# Patient Record
Sex: Male | Born: 1975 | Race: White | Hispanic: No | Marital: Married | Smoking: Former smoker
Health system: Southern US, Community
[De-identification: ages and names within clinical notes are randomized; demographics above are authoritative.]

## PROBLEM LIST (undated history)

## (undated) DIAGNOSIS — F1011 Alcohol abuse, in remission: Secondary | ICD-10-CM

## (undated) DIAGNOSIS — F1911 Other psychoactive substance abuse, in remission: Secondary | ICD-10-CM

## (undated) HISTORY — DX: Other psychoactive substance abuse, in remission: F19.11

## (undated) HISTORY — DX: Alcohol abuse, in remission: F10.11

---

## 2005-10-21 HISTORY — PX: HAND NERVE REPAIR: SHX1728

## 2008-01-21 ENCOUNTER — Encounter: Payer: Self-pay | Admitting: Internal Medicine

## 2008-01-22 ENCOUNTER — Ambulatory Visit: Payer: Self-pay | Admitting: Internal Medicine

## 2008-01-22 DIAGNOSIS — M5430 Sciatica, unspecified side: Secondary | ICD-10-CM

## 2008-01-25 LAB — CONVERTED CEMR LAB
HDL: 38 mg/dL — ABNORMAL LOW (ref >39.0)
LDL Cholesterol: 135 mg/dL — ABNORMAL HIGH (ref 0–99)
Total CHOL/HDL Ratio: 4.8
Triglycerides: 52 mg/dL (ref 0–149)
VLDL: 10 mg/dL (ref 0–40)

## 2008-03-02 ENCOUNTER — Ambulatory Visit (HOSPITAL_COMMUNITY): Admission: RE | Admit: 2008-03-02 | Discharge: 2008-03-02 | Payer: Self-pay | Admitting: Orthopedic Surgery

## 2008-03-16 ENCOUNTER — Ambulatory Visit (HOSPITAL_COMMUNITY): Admission: RE | Admit: 2008-03-16 | Discharge: 2008-03-17 | Payer: Self-pay | Admitting: Orthopedic Surgery

## 2009-01-03 ENCOUNTER — Emergency Department (HOSPITAL_COMMUNITY): Admission: EM | Admit: 2009-01-03 | Discharge: 2009-01-03 | Payer: Self-pay | Admitting: Family Medicine

## 2009-01-10 ENCOUNTER — Ambulatory Visit: Payer: Self-pay | Admitting: Family Medicine

## 2009-01-10 DIAGNOSIS — M79609 Pain in unspecified limb: Secondary | ICD-10-CM | POA: Insufficient documentation
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
## 2009-11-13 ENCOUNTER — Ambulatory Visit: Payer: Self-pay | Admitting: Internal Medicine

## 2009-11-13 DIAGNOSIS — J019 Acute sinusitis, unspecified: Secondary | ICD-10-CM | POA: Insufficient documentation

## 2010-08-13 ENCOUNTER — Emergency Department (HOSPITAL_COMMUNITY): Admission: EM | Admit: 2010-08-13 | Discharge: 2010-08-13 | Payer: Self-pay | Admitting: Emergency Medicine

## 2010-11-20 NOTE — Assessment & Plan Note (Signed)
Summary: ST,CONGESTION/CLE   Vital Signs:  Patient profile:   35 year old male Weight:      240 pounds BMI:     33.59 Temp:     97.9 degrees F oral Pulse rate:   72 / minute Pulse rhythm:   regular Resp:     14 per minute BP sitting:   138 / 80  (left arm) Cuff size:   large  Vitals Entered By: Mervin Hack CMA Duncan Dull) (November 13, 2009 2:28 PM) CC: sinus, headache, sore throat   History of Present Illness: Has been congested with head cold for 2 months variable symptoms---just never going away both daughters have been sick intermittently  worst in AM , then seems to clear some during day Has noted some pain along left face Now with pain along left neck and posterior neck  No fever that he can tell Nasal drainage with PND causing sore throat not much cough Clear to yellow and occ brownish mucus No ear pain  Hasn't really been taking anything except robitussin  Allergies: No Known Drug Allergies  Past History:  Past medical, surgical, family and social histories (including risk factors) reviewed for relevance to current acute and chronic problems.  Past Medical History: Reviewed history from 01/22/2008 and no changes required. 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 03/20/2006  Right hand repair (tendons, etc)    [facility]  Family History: Reviewed history from 01/22/2008 and no changes required. Mom and Dad generally healthy Brother died of apparent overdose 03-20-2008 Alcoholism and ?drug abuse in the family 2 stepbrothers, 1 stepsister No CAD, DM HTN and high chol in family Mat GM & aunt with lung cancer No prostate or colon cancer  Social History: Occupation: Forensic psychologist girls Former Smoker Alcohol use-no    Quit 3 years ago  Drug use-no   Quit 3 years ago  Review of Systems       No vomiting  slight loose stools Some brief  chills yesterday--like 20 seconds slight nausea last night  Physical Exam  General:  alert.  NAD Head:  right maxillary tenderness no frontal tenderness Ears:  R ear normal and L ear normal.   Nose:  marked inflammation bilat green mucus on right Mouth:  no erythema and no exudates.   Neck:  supple and no masses.  Tender nodes in the right anterior cervical chain Lungs:  normal respiratory effort, no intercostal retractions, no accessory muscle use, and normal breath sounds.     Impression & Recommendations:  Problem # 1:  SINUSITIS - ACUTE-NOS (ICD-461.9) Assessment New  clearly seems to have bacterial infeciton at this point will recommend analgesics amoxil  His updated medication list for this problem includes:    Amoxicillin 500 Mg Tabs (Amoxicillin) .Marland Kitchen... 2 tabs by mouth two times a day for sinus infection4  Complete Medication List: 1)  Multivitamins Tabs (Multiple vitamin) .... Take 1 tablet by mouth once a day 2)  Amoxicillin 500 Mg Tabs (Amoxicillin) .... 2 tabs by mouth two times a day for sinus infection4  Patient Instructions: 1)  Please schedule a follow-up appointment as needed .  Prescriptions: AMOXICILLIN 500 MG TABS (AMOXICILLIN) 2 tabs by mouth two times a day for sinus infection4  #40 x 0   Entered and Authorized by:   Cindee Salt MD   Signed by:   Cindee Salt MD on 11/13/2009   Method  used:   Electronically to        [facility]  [address] (retail)       [address]       [place], [place]  [address]       Ph: [phone] or [phone]       Fax: [phone]   RxID:   [id number]   Current Allergies (reviewed today): No known allergies

## 2011-03-05 NOTE — Op Note (Signed)
NAMEBHARGAV, Dustin Hanna            ACCOUNT NO.:  [id number]   MEDICAL RECORD NO.:  [id number]           PATIENT TYPE:   LOCATION:                                 FACILITY:   PHYSICIAN:  Alvy Beal, MD    DATE OF BIRTH:  [birth year]   DATE OF PROCEDURE:  DATE OF DISCHARGE:                               OPERATIVE REPORT   PREOPERATIVE DIAGNOSIS:  Left L5-S1 disk herniation.   POSTOPERATIVE DIAGNOSIS:  Left L5-S1 disk herniation.   OPERATIVE PROCEDURE:  Posterior lumbar laminectomy for diskectomy, CPT  code 630.   COMPLICATIONS:  None.   CONDITION:  Stable.   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  He states that the pain has gone to the  point where he cannot function.  Despite appropriate conservative  management, he continues to have significant pain.  As a result, he  elected to proceed with surgery.  All appropriate risks, benefits, and  alternatives were discussed with the patient and consent was obtained.   OPERATIVE NOTE:  The patient was brought to the operating room, placed  supine on the operating table.  After successful induction of general  anesthesia endotracheal intubation, TED were applied.  He was turned  prone onto a Wilson frame.  All bony prominences were well padded and  the back was prepped and draped in a standard fashion.   Two 18-gauge needles were placed on the back to localize for incision.  Once the L5-S1 interbody space was identified, a midline 2-inch incision  was made centered at that disk space.  Sharp dissection was carried out  down through the deep fascia.  Using a Cobb elevator, I resected all of  the soft tissue to expose the L5-S1 interspinous process space on the  left hand side.  I then placed a Taylor retractor, placed a Penfield 4  underneath the lamina of L5, and took a second intraoperative x-ray.  Confirming that I was that the L5-S1 level, I then proceeded with the  diskectomy.   L5 laminotomy was performed using a 3-mm Kerrison, and then I developed  a plane between the ligamentum flavum and underlying dura.  I resected a  portion of the ligamentum flavum to more down into the lateral recess.  At this point, I could palpate the disk fragment.  I used a Penfield 4  to sweep the thecal sac and nerve root medially so that I could have a  better visualization of the disk fragment.  Once this was accomplished,  I incised the disk space with a 15-blade scalpel and then used a  combination of pituitary rongeurs, curettes, and Kerrison rongeurs to  resect the disk fragment.  A generous amount of herniated disk material  was resected.  At this point, once I had majority of the disk herniation  now, I noted there was less tension on the traversing S1 nerve root.  I  then took a dural elevator (hockey stick) and passed  it inferiorly,  medially, and superiorly and in the lateral recess.  This confirmed that  there was no resistance and no significant remaining disk fragment.  I  then took a nerve hook and swept underneath the annulus in a  circumferential manner to ensure that there was no free fragments of  disk.  Once I confirmed that I had removed all the fragments of disk and  the nerve root was no longer under tension, I then irrigated the wound  copiously with normal saline, obtained hemostasis using bipolar  electrocautery, and maintained it with a thrombin-soaked Gelfoam patty  and closed the deep fascia with interrupted #1 Vicryl sutures and  superficial 2-0 Vicryl  sutures.  I used a 4-0 Monocryl to close the skin.  Steri-Strips and dry  dressing were applied.  The patient was extubated and transferred to the  PACU without incident.  At the end of the case, all needle and sponge  counts were correct.      Alvy Beal, MD  Electronically Signed     DDB/MEDQ  D:   03/16/2008  T:  03/17/2008  Job:  [id number]

## 2011-07-17 LAB — CBC
Hemoglobin: 15.3
MCHC: 35.8
Platelets: 221
RDW: 12.9

## 2011-07-17 LAB — DIFFERENTIAL
Basophils Absolute: 0
Basophils Relative: 0
Monocytes Absolute: 0.4
Neutro Abs: 3.1

## 2011-07-23 ENCOUNTER — Encounter: Payer: Self-pay | Admitting: Internal Medicine

## 2011-07-24 ENCOUNTER — Ambulatory Visit (INDEPENDENT_AMBULATORY_CARE_PROVIDER_SITE_OTHER): Payer: BC Managed Care – PPO | Admitting: Internal Medicine

## 2011-07-24 ENCOUNTER — Encounter: Payer: Self-pay | Admitting: Internal Medicine

## 2011-07-24 DIAGNOSIS — M71339 Other bursal cyst, unspecified wrist: Secondary | ICD-10-CM | POA: Insufficient documentation

## 2011-07-24 DIAGNOSIS — B356 Tinea cruris: Secondary | ICD-10-CM | POA: Insufficient documentation

## 2011-07-24 DIAGNOSIS — M6740 Ganglion, unspecified site: Secondary | ICD-10-CM

## 2011-07-24 MED ORDER — KETOCONAZOLE 2 % EX CREA: TOPICAL_CREAM | Freq: Two times a day (BID) | CUTANEOUS | Status: AC

## 2011-07-24 NOTE — Progress Notes (Signed)
  Subjective:    Patient ID: Dustin Hanna, male    DOB: Feb 25, 1976, 35 y.o.   MRN: [id number]  HPI sarted with right wrist pain about a month ago Repetitive movement tightening conduit couplings Also some pain on left This seemed to ease up  Developed lump on volar right wrist then Tender Wife wanted him to get it checked  No current outpatient prescriptions on file prior to visit.    No Known Allergies  Past Medical History  Diagnosis Date  . H/O alcohol abuse   . History of drug abuse     Past Surgical History  Procedure Date  . Hand nerve repair 2007    tendon repair    Family History  Problem Relation Age of Onset  . Healthy Mother   . Healthy Father   . Diabetes Neg Hx   . Heart disease Neg Hx   . Cancer Neg Hx     History   Social History  . Marital Status: Married    Spouse Name: N/A    Number of Children: 2  . Years of Education: N/A   Occupational History  . electrician--commercial installation    Social History Main Topics  . Smoking status: Former Smoker    Types: Cigarettes  . Smokeless tobacco: Never Used  . Alcohol Use: No     quit 3 years ago  . Drug Use: No     quit 3 years ago  . Sexually Active: Not on file   Other Topics Concern  . Not on file   Social History Narrative  . No narrative on file   Review of Systems No sig hand weakness now--some at first No joint issues     Objective:   Physical Exam  Constitutional: He appears well-developed and well-nourished. No distress.  Musculoskeletal:       Both wrists have no swelling Full ROM  Apparent synovial cyst on volar ulnar right wrist--mild tenderness  Neurological:       Normal strength in hands          Assessment & Plan:

## 2011-07-24 NOTE — Assessment & Plan Note (Signed)
Discussed that this may be self limited Can try compression Referral to hand surgeon if pain is an issue

## 2011-07-24 NOTE — Patient Instructions (Signed)
Please use compression on the synovial cyst If pain gets very bad, call for referral to hand surgeon  Use powder over the prescription cream in the groin to reduce moisture

## 2011-07-24 NOTE — Assessment & Plan Note (Signed)
Has had some sig itching since needing to be in a harness all day lately OTC creams only temporary relief  Will give ketoconazole Powder also

## 2011-08-11 ENCOUNTER — Emergency Department (HOSPITAL_COMMUNITY)
Admission: EM | Admit: 2011-08-11 | Discharge: 2011-08-11 | Disposition: A | Discharge status: Home / Self Care | Payer: BC Managed Care – PPO | Attending: Emergency Medicine | Admitting: Emergency Medicine

## 2011-08-11 ENCOUNTER — Emergency Department (HOSPITAL_COMMUNITY): Payer: BC Managed Care – PPO

## 2011-08-11 DIAGNOSIS — R079 Chest pain, unspecified: Secondary | ICD-10-CM | POA: Insufficient documentation

## 2011-08-11 DIAGNOSIS — R209 Unspecified disturbances of skin sensation: Secondary | ICD-10-CM | POA: Insufficient documentation

## 2011-08-11 LAB — POCT I-STAT TROPONIN I
Troponin i, poc: 0 ng/mL (ref 0.00–0.08)
Troponin i, poc: 0 ng/mL (ref 0.00–0.08)

## 2011-08-11 LAB — CBC
HCT: 40.7 % (ref 39.0–52.0)
Platelets: 199 10*3/uL (ref 150–400)
RDW: 12.6 % (ref 11.5–15.5)
WBC: 5.3 10*3/uL (ref 4.0–10.5)

## 2011-08-11 LAB — DIFFERENTIAL
Basophils Absolute: 0 10*3/uL (ref 0.0–0.1)
Eosinophils Absolute: 0.2 10*3/uL (ref 0.0–0.7)
Eosinophils Relative: 4 % (ref 0–5)
Lymphocytes Relative: 34 % (ref 12–46)

## 2011-08-11 LAB — POCT I-STAT, CHEM 8
Chloride: 103 mEq/L (ref 96–112)
HCT: 44 % (ref 39.0–52.0)
Potassium: 4 mEq/L (ref 3.5–5.1)

## 2011-10-10 ENCOUNTER — Ambulatory Visit: Payer: BC Managed Care – PPO | Admitting: Family Medicine

## 2014-06-09 ENCOUNTER — Ambulatory Visit: Payer: BC Managed Care – PPO | Admitting: Internal Medicine

## 2014-06-13 ENCOUNTER — Encounter: Payer: Self-pay | Admitting: Internal Medicine

## 2014-06-13 ENCOUNTER — Ambulatory Visit (INDEPENDENT_AMBULATORY_CARE_PROVIDER_SITE_OTHER): Payer: BC Managed Care – PPO | Admitting: Internal Medicine

## 2014-06-13 VITALS — BP 130/80 | HR 72 | Temp 98.1°F | Wt 256.0 lb

## 2014-06-13 DIAGNOSIS — Z23 Encounter for immunization: Secondary | ICD-10-CM

## 2014-06-13 DIAGNOSIS — E669 Obesity, unspecified: Secondary | ICD-10-CM

## 2014-06-13 DIAGNOSIS — R209 Unspecified disturbances of skin sensation: Secondary | ICD-10-CM

## 2014-06-13 LAB — CBC WITH DIFFERENTIAL/PLATELET
BASOS PCT: 0.5 % (ref 0.0–3.0)
Basophils Absolute: 0 10*3/uL (ref 0.0–0.1)
EOS ABS: 0.2 10*3/uL (ref 0.0–0.7)
EOS PCT: 3.2 % (ref 0.0–5.0)
HCT: 43.5 % (ref 39.0–52.0)
Hemoglobin: 15.1 g/dL (ref 13.0–17.0)
LYMPHS PCT: 28 % (ref 12.0–46.0)
Lymphs Abs: 1.9 10*3/uL (ref 0.7–4.0)
MCHC: 34.7 g/dL (ref 30.0–36.0)
MCV: 91.3 fl (ref 78.0–100.0)
MONO ABS: 0.5 10*3/uL (ref 0.1–1.0)
Monocytes Relative: 6.9 % (ref 3.0–12.0)
NEUTROS PCT: 61.4 % (ref 43.0–77.0)
Neutro Abs: 4.1 10*3/uL (ref 1.4–7.7)
PLATELETS: 248 10*3/uL (ref 150.0–400.0)
RBC: 4.77 Mil/uL (ref 4.22–5.81)
RDW: 12.8 % (ref 11.5–15.5)
WBC: 6.7 10*3/uL (ref 4.0–10.5)

## 2014-06-13 LAB — COMPREHENSIVE METABOLIC PANEL
ALBUMIN: 4.3 g/dL (ref 3.5–5.2)
ALK PHOS: 68 U/L (ref 39–117)
ALT: 60 U/L — ABNORMAL HIGH (ref 0–53)
AST: 35 U/L (ref 0–37)
BUN: 16 mg/dL (ref 6–23)
CALCIUM: 9.4 mg/dL (ref 8.4–10.5)
CHLORIDE: 102 meq/L (ref 96–112)
CO2: 29 meq/L (ref 19–32)
Creatinine, Ser: 0.9 mg/dL (ref 0.4–1.5)
GFR: 106.91 mL/min (ref >60.00)
GLUCOSE: 94 mg/dL (ref 70–99)
POTASSIUM: 4.1 meq/L (ref 3.5–5.1)
SODIUM: 138 meq/L (ref 135–145)
TOTAL PROTEIN: 7.3 g/dL (ref 6.0–8.3)
Total Bilirubin: 0.6 mg/dL (ref 0.2–1.2)

## 2014-06-13 LAB — VITAMIN B12: VITAMIN B 12: 603 pg/mL (ref 211–911)

## 2014-06-13 LAB — T4, FREE: FREE T4: 0.97 ng/dL (ref 0.60–1.60)

## 2014-06-13 LAB — HEMOGLOBIN A1C: HEMOGLOBIN A1C: 5.3 % (ref 4.6–6.5)

## 2014-06-13 NOTE — Assessment & Plan Note (Signed)
Apparent neuropathy Doesn't seem to be disc related--no focal neuro findings otherwise Need to check labs--B12, thyroid, glucose Could be mechanical with lots of time in work boots Doesn't seem to have arch problems (they are symmetric and symptoms don't appear to be from pronation problems)  Will check labs Needs to make sure boots fit correctly--the thinks they do Might need reeval by back specialists--though this seems unlikely

## 2014-06-13 NOTE — Addendum Note (Signed)
Addended by: Despina Hidden on: 06/13/2014 12:01 PM   Modules accepted: Orders

## 2014-06-13 NOTE — Assessment & Plan Note (Signed)
Discussed healthy diet

## 2014-06-13 NOTE — Progress Notes (Signed)
Pre visit review using our clinic review tool, if applicable. No additional management support is needed unless otherwise documented below in the visit note. 

## 2014-06-13 NOTE — Progress Notes (Signed)
   Subjective:    Patient ID: Dustin Hanna, male    DOB: 09/14/1976, 38 y.o.   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   No current facility-administered medications on file prior to visit.    No Known Allergies  Past Medical History  Diagnosis Date  . H/O alcohol abuse   . History of drug abuse     Past Surgical History  Procedure Laterality Date  . Hand nerve repair  2007    tendon repair    Family History  Problem Relation Age of Onset  . Healthy Mother   . Healthy Father   . Diabetes Neg Hx   . Heart disease Neg Hx   . Cancer Neg Hx     History   Social History  . Marital Status: Married    Spouse Name: N/A    Number of Children: 4  . Years of Education: N/A   Occupational History  . electrician--commercial installation    Social History Main Topics  . Smoking status: Former Smoker    Types: Cigarettes  . Smokeless tobacco: Never Used  . Alcohol Use: No     Comment: quit 3 years ago  . Drug Use: No     Comment: quit 3 years ago  . Sexual Activity: Not on file   Other Topics Concern  . Not on file   Social History Narrative  . No narrative on file   Review of Systems Weight is up 6# in past 3 years Not careful with diet No fevers and not sick No increase in urination, ?increased thirst in day No slow healing that he is aware of      Objective:   Physical Exam  Constitutional: He appears well-nourished. No distress.  Neck: Normal range of motion. Neck supple. No thyromegaly present.  Cardiovascular: Normal rate, regular rhythm, normal heart sounds and intact distal pulses.  Exam reveals no gallop.   No murmur heard. Pulmonary/Chest: Effort normal and breath sounds normal. No respiratory distress. He has no wheezes. He has no rales.  Abdominal: Soft. There is no tenderness. There is no rebound.  Musculoskeletal: He exhibits no edema and no tenderness.  Lymphadenopathy:    He has no cervical adenopathy.  Neurological:  Abnormal sensation in toes bilaterally  Skin: No rash noted.  Psychiatric: He has a normal mood and affect. His behavior is normal.          Assessment & Plan:

## 2014-06-13 NOTE — Patient Instructions (Signed)
DASH Eating Plan °DASH stands for "Dietary Approaches to Stop Hypertension." The DASH eating plan is a healthy eating plan that has been shown to reduce high blood pressure (hypertension). Additional health benefits may include reducing the risk of type 2 diabetes mellitus, heart disease, and stroke. The DASH eating plan may also help with weight loss. °WHAT DO I NEED TO KNOW ABOUT THE DASH EATING PLAN? °For the DASH eating plan, you will follow these general guidelines: °· Choose foods with a percent daily value for sodium of less than 5% (as listed on the food label). °· Use salt-free seasonings or herbs instead of table salt or sea salt. °· Check with your health care provider or pharmacist before using salt substitutes. °· Eat lower-sodium products, often labeled as "lower sodium" or "no salt added." °· Eat fresh foods. °· Eat more vegetables, fruits, and low-fat dairy products. °· Choose whole grains. Look for the word "whole" as the first word in the ingredient list. °· Choose fish and skinless chicken or turkey more often than red meat. Limit fish, poultry, and meat to 6 oz (170 g) each day. °· Limit sweets, desserts, sugars, and sugary drinks. °· Choose heart-healthy fats. °· Limit cheese to 1 oz (28 g) per day. °· Eat more home-cooked food and less restaurant, buffet, and fast food. °· Limit fried foods. °· Cook foods using methods other than frying. °· Limit canned vegetables. If you do use them, rinse them well to decrease the sodium. °· When eating at a restaurant, ask that your food be prepared with less salt, or no salt if possible. °WHAT FOODS CAN I EAT? °Seek help from a dietitian for individual calorie needs. °Grains °Whole grain or whole wheat bread. Brown rice. Whole grain or whole wheat pasta. Quinoa, bulgur, and whole grain cereals. Low-sodium cereals. Corn or whole wheat flour tortillas. Whole grain cornbread. Whole grain crackers. Low-sodium crackers. °Vegetables °Fresh or frozen vegetables  (raw, steamed, roasted, or grilled). Low-sodium or reduced-sodium tomato and vegetable juices. Low-sodium or reduced-sodium tomato sauce and paste. Low-sodium or reduced-sodium canned vegetables.  °Fruits °All fresh, canned (in natural juice), or frozen fruits. °Meat and Other Protein Products °Ground beef (85% or leaner), grass-fed beef, or beef trimmed of fat. Skinless chicken or turkey. Ground chicken or turkey. Pork trimmed of fat. All fish and seafood. Eggs. Dried beans, peas, or lentils. Unsalted nuts and seeds. Unsalted canned beans. °Dairy °Low-fat dairy products, such as skim or 1% milk, 2% or reduced-fat cheeses, low-fat ricotta or cottage cheese, or plain low-fat yogurt. Low-sodium or reduced-sodium cheeses. °Fats and Oils °Tub margarines without trans fats. Light or reduced-fat mayonnaise and salad dressings (reduced sodium). Avocado. Safflower, olive, or canola oils. Natural peanut or almond butter. °Other °Unsalted popcorn and pretzels. °The items listed above may not be a complete list of recommended foods or beverages. Contact your dietitian for more options. °WHAT FOODS ARE NOT RECOMMENDED? °Grains °White bread. White pasta. White rice. Refined cornbread. Bagels and croissants. Crackers that contain trans fat. °Vegetables °Creamed or fried vegetables. Vegetables in a cheese sauce. Regular canned vegetables. Regular canned tomato sauce and paste. Regular tomato and vegetable juices. °Fruits °Dried fruits. Canned fruit in light or heavy syrup. Fruit juice. °Meat and Other Protein Products °Fatty cuts of meat. Ribs, chicken wings, bacon, sausage, bologna, salami, chitterlings, fatback, hot dogs, bratwurst, and packaged luncheon meats. Salted nuts and seeds. Canned beans with salt. °Dairy °Whole or 2% milk, cream, half-and-half, and cream cheese. Whole-fat or sweetened yogurt. Full-fat   cheeses or blue cheese. Nondairy creamers and whipped toppings. Processed cheese, cheese spreads, or cheese  curds. °Condiments °Onion and garlic salt, seasoned salt, table salt, and sea salt. Canned and packaged gravies. Worcestershire sauce. Tartar sauce. Barbecue sauce. Teriyaki sauce. Soy sauce, including reduced sodium. Steak sauce. Fish sauce. Oyster sauce. Cocktail sauce. Horseradish. Ketchup and mustard. Meat flavorings and tenderizers. Bouillon cubes. Hot sauce. Tabasco sauce. Marinades. Taco seasonings. Relishes. °Fats and Oils °Butter, stick margarine, lard, shortening, ghee, and bacon fat. Coconut, palm kernel, or palm oils. Regular salad dressings. °Other °Pickles and olives. Salted popcorn and pretzels. °The items listed above may not be a complete list of foods and beverages to avoid. Contact your dietitian for more information. °WHERE CAN I FIND MORE INFORMATION? °National Heart, Lung, and Blood Institute: www.nhlbi.nih.gov/health/health-topics/topics/dash/ °Document Released: 09/26/2011 Document Revised: 02/21/2014 Document Reviewed: 08/11/2013 °ExitCare® Patient Information ©2015 [employer]. This information is not intended to replace advice given to you by your health care provider. Make sure you discuss any questions you have with your health care provider. ° °

## 2014-06-14 ENCOUNTER — Encounter: Payer: Self-pay | Admitting: *Deleted

## 2016-01-15 ENCOUNTER — Ambulatory Visit: Payer: Self-pay | Admitting: Internal Medicine

## 2016-02-21 ENCOUNTER — Other Ambulatory Visit: Payer: Self-pay | Admitting: Family Medicine

## 2016-02-21 DIAGNOSIS — M7989 Other specified soft tissue disorders: Secondary | ICD-10-CM

## 2016-02-22 ENCOUNTER — Ambulatory Visit
Admission: RE | Admit: 2016-02-22 | Discharge: 2016-02-22 | Disposition: A | Discharge status: Home / Self Care | Payer: BLUE CROSS/BLUE SHIELD | Source: Ambulatory Visit | Attending: Family Medicine | Admitting: Family Medicine

## 2016-02-22 DIAGNOSIS — M7989 Other specified soft tissue disorders: Secondary | ICD-10-CM

## 2016-07-06 IMAGING — US US CHEST/MEDIASTINUM
1 series · 7 of 7 positions shown · non-contrast
Comparison: Chest x-ray of 08/11/2011

CLINICAL DATA: Palpable area in the left upper back

EXAM:
CHEST ULTRASOUND

[Series 1: us chest/mediastinum · 0.13mm/px · 7 acquisitions, 7 frames shown]
[im 1/7]
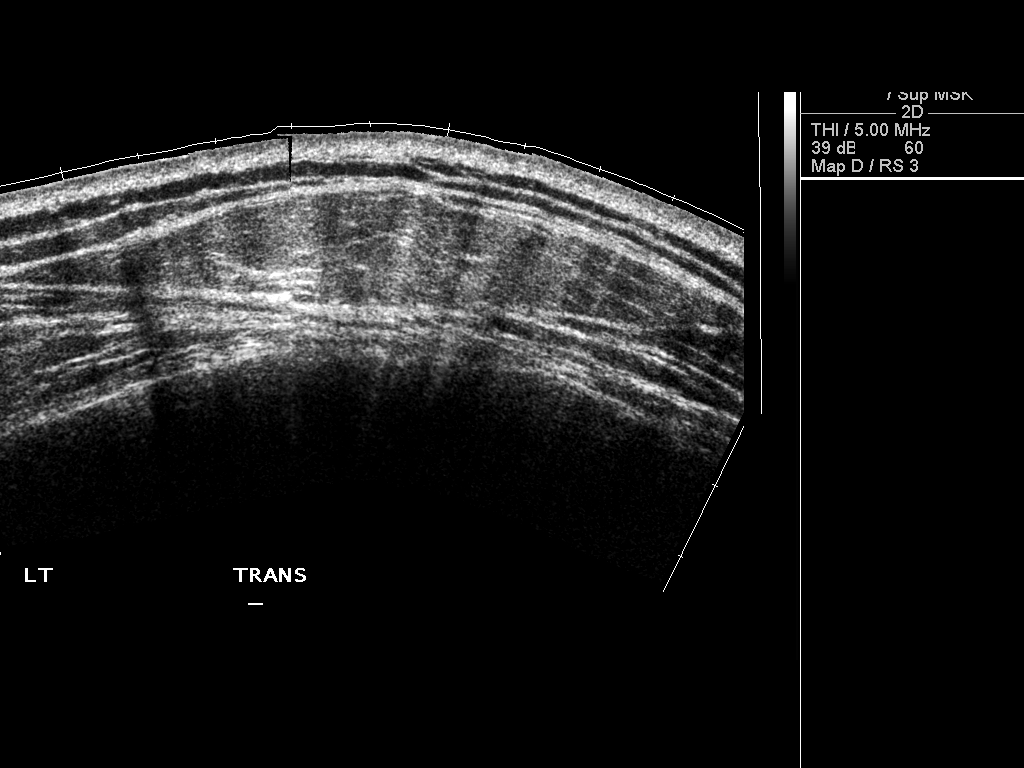
[im 2/7]
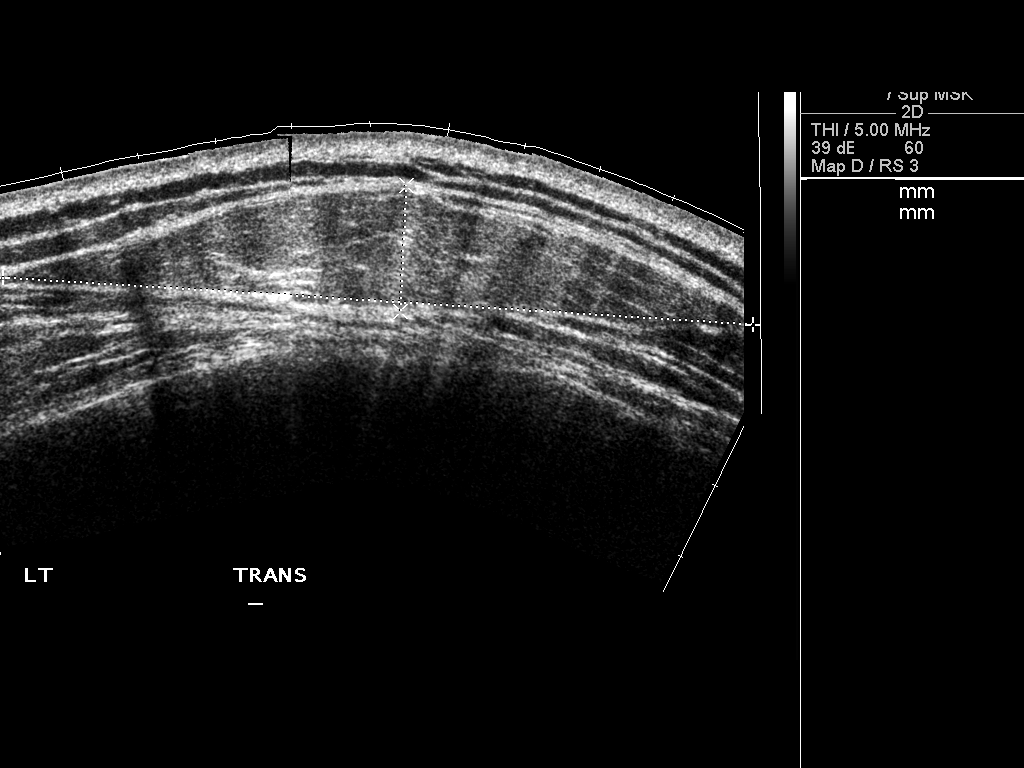
[im 3/7]
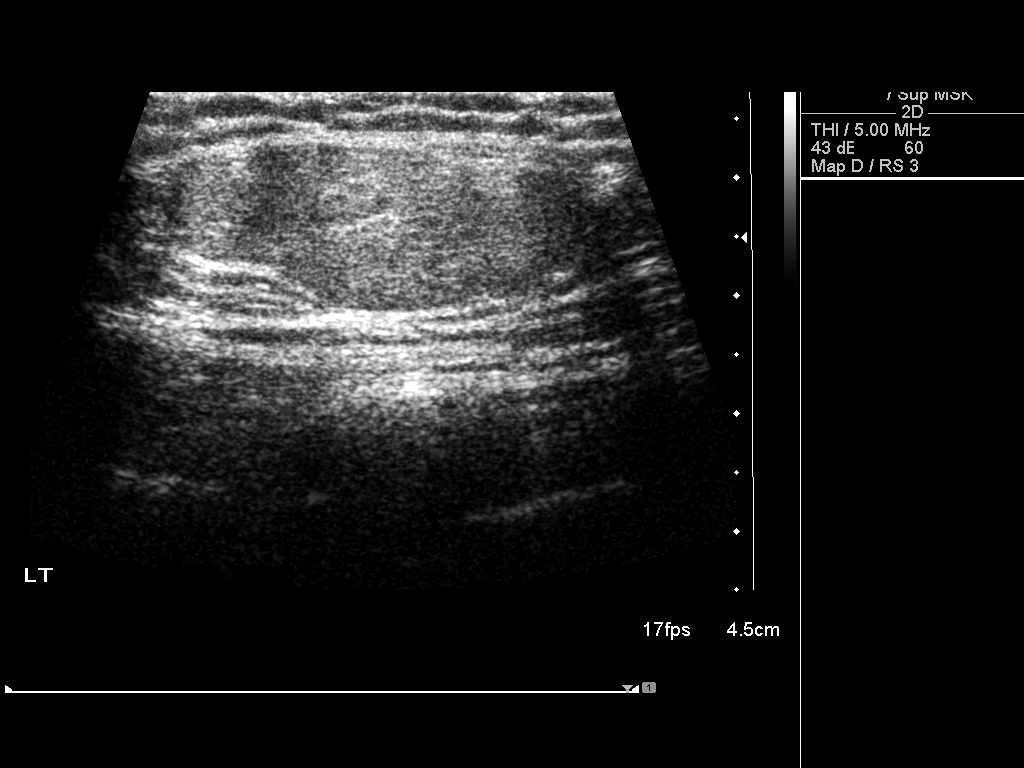
[im 4/7]
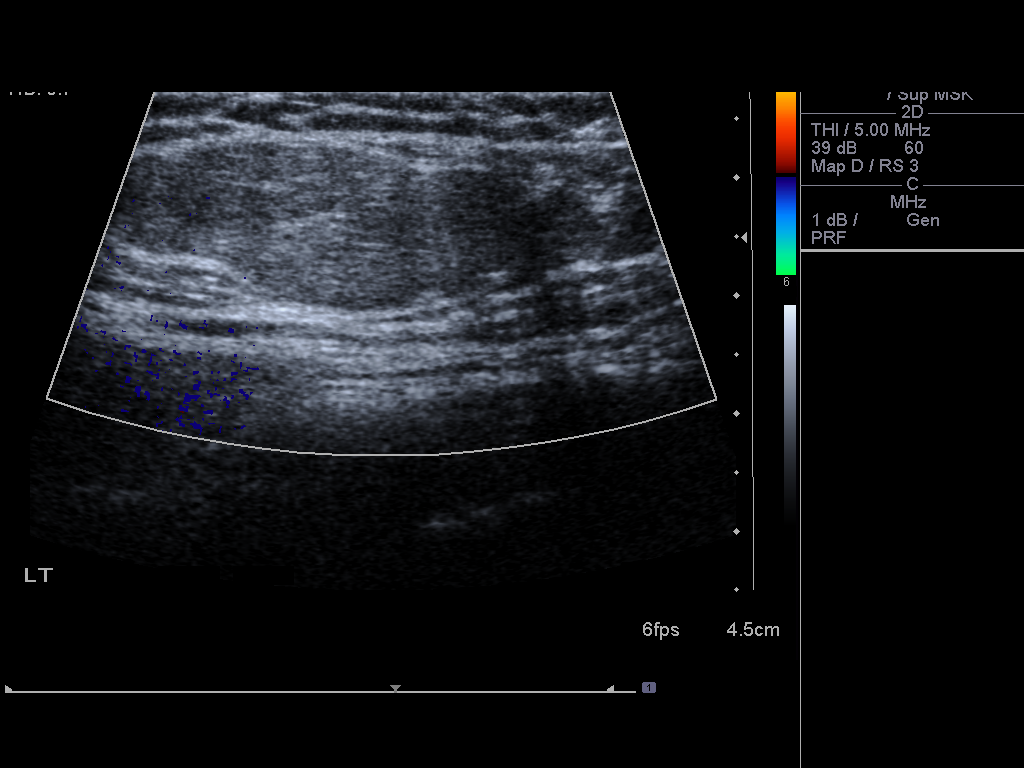
[im 5/7]
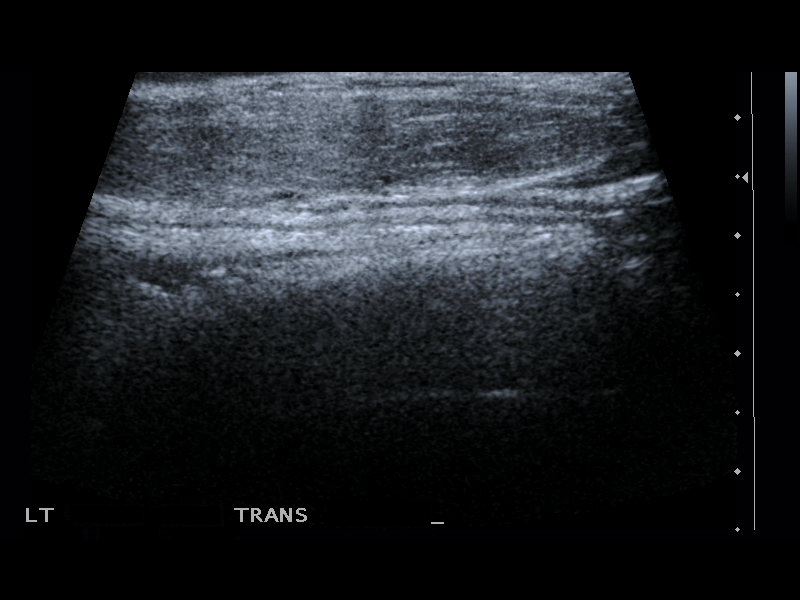
[im 6/7]
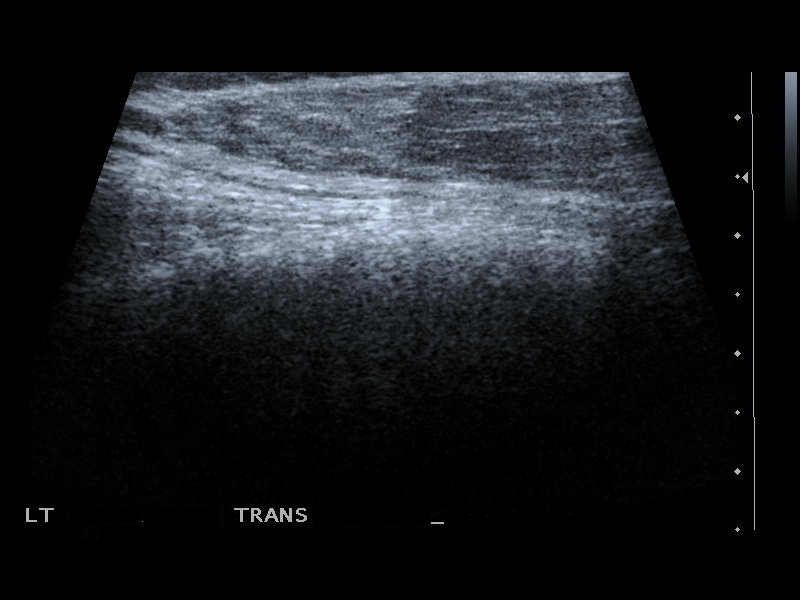
[im 7/7]
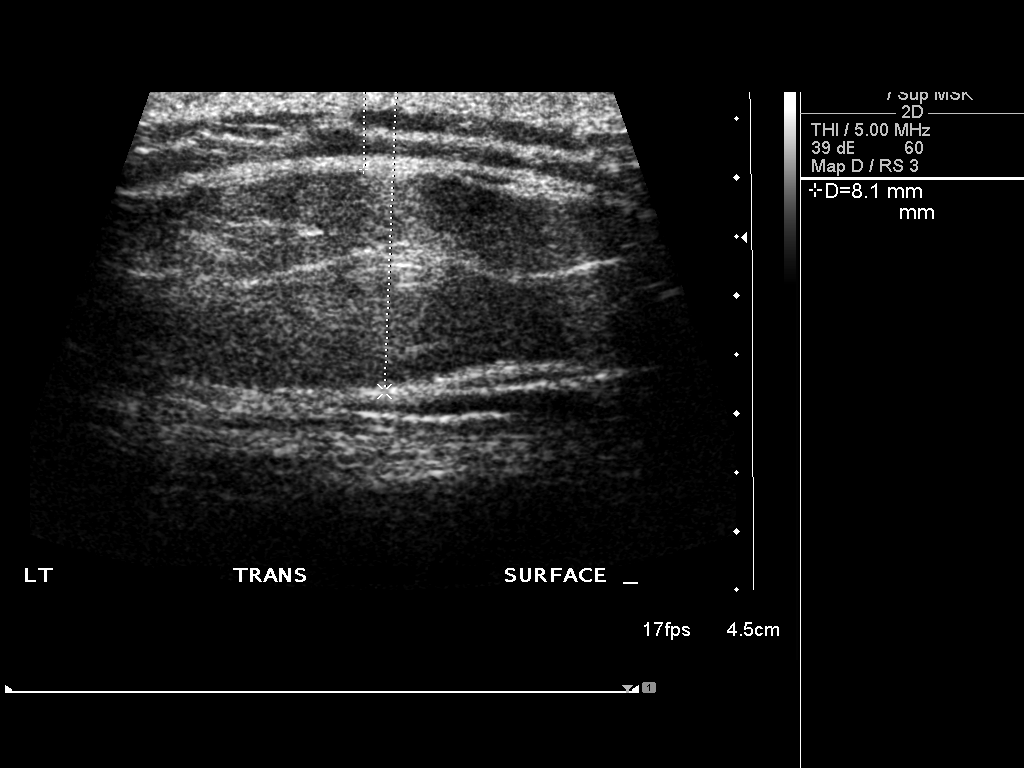

[7 of 7 positions shown; findings below may reference images not displayed]

FINDINGS: Ultrasound over the area in question was performed. Ultrasound
demonstrates a superficial oval soft tissue structure measuring
x 9.6 x 1.6 cm. By ultrasound this is most consistent with a lipoma.
By physical exam, this is rather malleable, typical of lipoma. No
internal blood flow is seen.
IMPRESSION: The area questioned in the left upper back is most typical of lipoma
by ultrasound.

## 2019-11-11 ENCOUNTER — Other Ambulatory Visit (HOSPITAL_BASED_OUTPATIENT_CLINIC_OR_DEPARTMENT_OTHER): Payer: Self-pay

## 2019-11-11 DIAGNOSIS — R0683 Snoring: Secondary | ICD-10-CM

## 2019-11-11 DIAGNOSIS — G4710 Hypersomnia, unspecified: Secondary | ICD-10-CM

## 2019-11-11 DIAGNOSIS — G4730 Sleep apnea, unspecified: Secondary | ICD-10-CM

## 2019-11-20 ENCOUNTER — Other Ambulatory Visit (HOSPITAL_COMMUNITY)
Admission: RE | Admit: 2019-11-20 | Discharge: 2019-11-20 | Disposition: A | Discharge status: Home / Self Care | Payer: BC Managed Care – PPO | Source: Ambulatory Visit | Attending: Internal Medicine | Admitting: Internal Medicine

## 2019-11-20 DIAGNOSIS — Z20822 Contact with and (suspected) exposure to covid-19: Secondary | ICD-10-CM | POA: Insufficient documentation

## 2019-11-20 DIAGNOSIS — Z01812 Encounter for preprocedural laboratory examination: Secondary | ICD-10-CM | POA: Diagnosis present

## 2019-11-20 LAB — SARS CORONAVIRUS 2 (TAT 6-24 HRS): SARS Coronavirus 2: NEGATIVE

## 2019-11-22 ENCOUNTER — Ambulatory Visit (HOSPITAL_BASED_OUTPATIENT_CLINIC_OR_DEPARTMENT_OTHER): Payer: BC Managed Care – PPO | Attending: Internal Medicine | Admitting: Internal Medicine

## 2019-11-22 ENCOUNTER — Other Ambulatory Visit: Payer: Self-pay

## 2019-11-22 DIAGNOSIS — G4710 Hypersomnia, unspecified: Secondary | ICD-10-CM | POA: Diagnosis not present

## 2019-11-22 DIAGNOSIS — R0683 Snoring: Secondary | ICD-10-CM | POA: Insufficient documentation

## 2019-11-22 DIAGNOSIS — G4730 Sleep apnea, unspecified: Secondary | ICD-10-CM | POA: Diagnosis not present

## 2019-11-27 NOTE — Procedures (Signed)
   NAME: Dustin Hanna DATE OF BIRTH:  [birth year] MEDICAL RECORD NUMBER [id number]  LOCATION: [facility]  PHYSICIAN: Marius Ditch  DATE OF STUDY: 11/22/2019  SLEEP STUDY TYPE: Nocturnal Polysomnogram               REFERRING PHYSICIAN: Marius Ditch, MD  INDICATION FOR STUDY: Severe excessive daytime sleepiness with h/o mild OSA on HSAT about five years ago  EPWORTH SLEEPINESS SCORE:  17 HEIGHT: 5' 11"  (180.3 cm)  WEIGHT: 275 lb (124.7 kg)    Body mass index is 38.35 kg/m.  NECK SIZE: 16 in.  MEDICATIONS Patient self administered medications include: N/A. Medications administered during study include No sleep medicine administered.Marland Kitchen  SLEEP STUDY TECHNIQUE A multi-channel overnight Polysomnography study was performed. The channels recorded and monitored were central and occipital EEG, electrooculogram (EOG), submentalis EMG (chin), nasal and oral airflow, thoracic and abdominal wall motion, anterior tibialis EMG, snore microphone, electrocardiogram, and a pulse oximetry.  TECHNICAL COMMENTS Comments added by Technician: NO RESTROOM VISTED. Patient had difficulty initiating sleep. Patient was restless all through the night. Comments added by Scorer: N/A  SLEEP ARCHITECTURE The study was initiated at 9:54:00 PM and terminated at 4:25:42 AM. The total recorded time was 391.7 minutes. EEG confirmed total sleep time was 336.5 minutes yielding a sleep efficiency of 85.9%%. Sleep onset after lights out was 8.9 minutes with a REM latency of 69.5 minutes. The patient spent 6.7%% of the night in stage N1 sleep, 61.7%% in stage N2 sleep, 0.0%% in stage N3 and 31.7% in REM. Wake after sleep onset (WASO) was 46.3 minutes. The Arousal Index was 8.9/hour.  RESPIRATORY PARAMETERS There were a total of 0 respiratory disturbances out of which 0 were apneas ( 0 obstructive, 0 mixed, 0 central) and 0 hypopneas. The apnea/hypopnea index (AHI) was 0.0 events/hour. The central  sleep apnea index was 0.0 events/hour. The REM AHI was 0.0 events/hour and NREM AHI was 0.0 events/hour. The supine AHI was 0.0 events/hour and the non supine AHI was 0 supine during 0.15% of sleep. Respiratory disturbances were associated with oxygen desaturation down to a nadir of 89.0% during sleep. The mean oxygen saturation during the study was 94.7%. The cumulative time under 88% oxygen saturation was 0 minutes.  LEG MOVEMENT DATA The total leg movements were 36 with a resulting leg movement index of 6.4/hr . Associated arousal with leg movement index was 0.2/hr.  CARDIAC DATA The underlying cardiac rhythm was most consistent with sinus rhythm. Mean heart rate during sleep was 70.7 bpm. Additional rhythm abnormalities include None.  IMPRESSIONS - No significant sleep disordered breathing - Periodic leg movements(PLMs) were seem during sleep. However, no significant associated arousals.  DIAGNOSIS - Excessive daytime sleepiness.   RECOMMENDATIONS - Consider MSLT  Marius Ditch Sleep specialist, American Board of Internal Medicine  ELECTRONICALLY SIGNED ON:  11/27/2019, 10:18 AM Cash PH: [phone]   FX: [phone] [place]

## 2019-12-26 ENCOUNTER — Ambulatory Visit: Payer: BC Managed Care – PPO | Attending: Internal Medicine

## 2019-12-26 DIAGNOSIS — Z23 Encounter for immunization: Secondary | ICD-10-CM | POA: Insufficient documentation

## 2019-12-26 NOTE — Progress Notes (Signed)
   Covid-19 Vaccination Clinic  Name:  Kiril Hippe    MRN: [id number] DOB: 1975/11/17  12/26/2019  Mr. Ikard was observed post Covid-19 immunization for 15 minutes without incident. He was provided with Vaccine Information Sheet and instruction to access the V-Safe system.   Mr. Bangs was instructed to call 911 with any severe reactions post vaccine: Marland Kitchen Difficulty breathing  . Swelling of face and throat  . A fast heartbeat  . A bad rash all over body  . Dizziness and weakness   Immunizations Administered    Name Date Dose VIS Date Route   Pfizer COVID-19 Vaccine 12/26/2019  4:37 PM 0.3 mL 10/01/2019 Intramuscular   Manufacturer: [place]   Lot: [id number]   [place]: [id number]

## 2020-01-16 ENCOUNTER — Other Ambulatory Visit: Payer: Self-pay

## 2020-01-16 ENCOUNTER — Ambulatory Visit: Payer: BC Managed Care – PPO | Attending: Internal Medicine

## 2020-01-16 DIAGNOSIS — Z23 Encounter for immunization: Secondary | ICD-10-CM

## 2020-01-16 NOTE — Progress Notes (Signed)
   Covid-19 Vaccination Clinic  Name:  Dustin Hanna    MRN: [id number] DOB: 1975/11/25  01/16/2020  Dustin Hanna was observed post Covid-19 immunization for 15 minutes without incident. He was provided with Vaccine Information Sheet and instruction to access the V-Safe system.   Dustin Hanna was instructed to call 911 with any severe reactions post vaccine: Marland Kitchen Difficulty breathing  . Swelling of face and throat  . A fast heartbeat  . A bad rash all over body  . Dizziness and weakness   Immunizations Administered    Name Date Dose VIS Date Route   Pfizer COVID-19 Vaccine 01/16/2020  2:42 PM 0.3 mL 10/01/2019 Intramuscular   Manufacturer: [place]   Lot: [id number]   [place]: [id number]

## 2022-01-02 LAB — EXTERNAL GENERIC LAB PROCEDURE: COLOGUARD: NEGATIVE

## 2023-02-10 NOTE — Progress Notes (Unsigned)
[facility] [address] [place] [address] Phone: [phone] Subjective:   Dustin Hanna, am serving as a scribe for Dr. Antoine Primas.  I'm seeing this patient by the request  of:  Karie Schwalbe, MD  CC: Left-sided back pain  BJY:[id number]  Dustin Hanna is a 47 y.o. male coming in with complaint of L foot and back pain. Patient states had back surgery in 2009 partial discectomy. A few years later he had budging disc which required an injection. Patient works an Personnel officer. Pain will radiate down R leg when pain is at its worst. Notes having sensitive feet since his surgery and he has to wear socks constantly.   Broke R pinky toe in 2017. Had to have screw place in 5th metatarsal. Last year he lunged into home plate during a softball game he played with his daughter. Painful L heel daily for past year since injury. Wearing his boots helps alleviate his pain. Pain worse after sitting in the car and in the mornings.       Past Medical History:  Diagnosis Date   H/O alcohol abuse    History of drug abuse (HCC)     Social History   Socioeconomic History   Marital status: Married    Spouse name: Not on file   Number of children: 4   Years of education: Not on file   Highest education level: Not on file  Occupational History   Occupation: Therapist, nutritional  Tobacco Use   Smoking status: Former    Types: Cigarettes   Smokeless tobacco: Never  Substance and Sexual Activity   Alcohol use: No    Comment: quit 3 years ago   Drug use: No    Comment: quit 3 years ago   Sexual activity: Not on file  Other Topics Concern   Not on file  Social History Narrative   Not on file   Social Determinants of Health   Financial Resource Strain: Not on file  Food Insecurity: Not on file  Transportation Needs: Not on file  Physical Activity: Not on file  Stress: Not on file  Social Connections: Not on file   No  Known Allergies Family History  Problem Relation Age of Onset   Healthy Mother    Healthy Father    Diabetes Neg Hx    Heart disease Neg Hx    Cancer Neg Hx      Current Outpatient Medications (Cardiovascular):    nitroGLYCERIN (NITRO-DUR) 0.2 mg/hr patch, Apply 1/4 of a patch to skin once daily.     Current Outpatient Medications (Other):    gabapentin (NEURONTIN) 100 MG capsule, Take 2 capsules (200 mg total) by mouth at bedtime.   Reviewed prior external information including notes and imaging from  primary care provider As well as notes that were available from care everywhere and other healthcare systems.  Past medical history, social, surgical and family history all reviewed in electronic medical record.  No pertanent information unless stated regarding to the chief complaint.   Review of Systems:  No headache, visual changes, nausea, vomiting, diarrhea, constipation, dizziness, abdominal pain, skin rash, fevers, chills, night sweats, weight loss, swollen lymph nodes,, joint swelling, chest pain, shortness of breath, mood changes. POSITIVE muscle aches, body aches  Objective  Blood pressure 120/84, pulse 69, height  (1.803 m), weight 273 lb (123.8 kg), SpO2 97 %.   General: No apparent distress alert and oriented x3 mood and affect  normal, dressed appropriately.  HEENT: Pupils equal, extraocular movements intact  Respiratory: Patient's speak in full sentences and does not appear short of breath  Cardiovascular: No lower extremity edema, non tender, no erythema  Patient's low back does have some loss lordosis.  Tender to palpation more over the L4-L5 and L5-S1 with some tenderness and more of the facet joints.  Worsening pain with flexion or extension.  Negative straight leg test noted.  Seems to be neurovascularly intact distally.  Left foot exam does show some breakdown of the transverse arch noted.  Patient does have tenderness to palpation over the plantar aspect  of the left foot and the medial calcaneal area.  Tightness in the posterior heel cord noted  Limited muscular skeletal ultrasound was performed and interpreted by Antoine Primas, M  Limited ultrasound does show the patient does have some thickening of the plantar fascia approximately 1 cm from the insertion.  No significant tearing noted.  No cortical irregularity noted to the calcaneal bone. Impression: Plantar fasciitis'  97110; 15 additional minutes spent for Therapeutic exercises as stated in above notes.  This included exercises focusing on stretching, strengthening, with significant focus on eccentric aspects.   Long term goals include an improvement in range of motion, strength, endurance as well as avoiding reinjury. Patient's frequency would include in 1-2 times a day, 3-5 times a week for a duration of 6-12 weeks. Exercises for the foot include:  Stretches to help lengthen the lower leg and plantar fascia areas Theraband exercises for the lower leg and ankle to help strengthen the surrounding area- dorsiflexion, plantarflexion, inversion, eversion Massage rolling on the plantar surface of the foot with a frozen bottle, tennis ball or golf ball Towel or marble pick-ups to strengthen the plantar surface of the foot Weight bearing exercises to increase balance and overall stability   Proper technique shown and discussed handout in great detail with ATC.  All questions were discussed and answered.      Impression and Recommendations:     The above documentation has been reviewed and is accurate and complete Judi Saa, DO

## 2023-02-11 ENCOUNTER — Ambulatory Visit: Payer: BC Managed Care – PPO | Admitting: Family Medicine

## 2023-02-11 ENCOUNTER — Encounter: Payer: Self-pay | Admitting: Family Medicine

## 2023-02-11 ENCOUNTER — Ambulatory Visit (INDEPENDENT_AMBULATORY_CARE_PROVIDER_SITE_OTHER): Payer: BC Managed Care – PPO

## 2023-02-11 ENCOUNTER — Other Ambulatory Visit: Payer: Self-pay

## 2023-02-11 VITALS — BP 120/84 | HR 69 | Ht 71.0 in | Wt 273.0 lb

## 2023-02-11 DIAGNOSIS — M5431 Sciatica, right side: Secondary | ICD-10-CM

## 2023-02-11 DIAGNOSIS — M545 Low back pain, unspecified: Secondary | ICD-10-CM

## 2023-02-11 DIAGNOSIS — M722 Plantar fascial fibromatosis: Secondary | ICD-10-CM | POA: Diagnosis not present

## 2023-02-11 DIAGNOSIS — M79672 Pain in left foot: Secondary | ICD-10-CM

## 2023-02-11 DIAGNOSIS — M5432 Sciatica, left side: Secondary | ICD-10-CM

## 2023-02-11 MED ORDER — GABAPENTIN 100 MG PO CAPS
200.0000 mg | ORAL_CAPSULE | Freq: Every day | ORAL | 0 refills | Status: DC
Start: 2023-02-11 — End: 2023-05-13

## 2023-02-11 MED ORDER — NITROGLYCERIN 0.2 MG/HR TD PT24: MEDICATED_PATCH | TRANSDERMAL | 0 refills | Status: AC

## 2023-02-11 NOTE — Patient Instructions (Signed)
Nitro patches Exercises Pneumatic brace for walking Xray lumbar spine Gabapentin  at night See me in 6 weeks

## 2023-02-11 NOTE — Assessment & Plan Note (Signed)
Continues to have some radicular symptoms.  Discussed HEP, worked with ATC tightness with straight leg test, patient does have obesity that is also contributing at this time.  Discussed about the weight loss.  Discussed home exercises and exercise regimen.  Started gabapentin for some of the radicular symptoms and warned of potential side effects.  Follow-up again in 6 to 8 weeks otherwise.

## 2023-02-11 NOTE — Assessment & Plan Note (Signed)
Plan of fasciitis, discussed icing regimen and home exercises, discussed proper shoes, avoiding being barefoot, importance of weight loss.  Nitroglycerin patches.  Warned of potential side effects of this.  Follow-up with me again in 6 to 8 weeks otherwise.

## 2023-03-10 ENCOUNTER — Other Ambulatory Visit: Payer: Self-pay | Admitting: Family Medicine

## 2023-03-10 NOTE — Telephone Encounter (Signed)
Last OV 02/11/23 Next OV 03/25/23  Last refill 02/11/23 Qty: #30/0  Requesting refill too soon

## 2023-03-24 NOTE — Progress Notes (Unsigned)
[facility] [address] [place] [address] Phone: [phone] Subjective:   INadine Counts, am serving as a scribe for Dr. Antoine Primas.  I'm seeing this patient by the request  of:  Karie Schwalbe, MD  CC: Back and heel  pain  BJY:[id number]  02/11/2023 Plan of fasciitis, discussed icing regimen and home exercises, discussed proper shoes, avoiding being barefoot, importance of weight loss. Nitroglycerin patches. Warned of potential side effects of this. Follow-up with me again in 6 to 8 weeks otherwise.   Continues to have some radicular symptoms.  Discussed HEP, worked with ATC tightness with straight leg test, patient does have obesity that is also contributing at this time.  Discussed about the weight loss.  Discussed home exercises and exercise regimen.  Started gabapentin for some of the radicular symptoms and warned of potential side effects.  Follow-up again in 6 to 8 weeks otherwise.  Updated 03/25/2023 Dustin Hanna is a 47 y.o. male coming in with complaint of back and heel pain. Heel pain is okay. Bottom is doing a lot better. Wearing the brace most of the time. Feels most pain where achilles inserts calcaneous. Back is the same. Did have some recently tingling in hands. Did have twinge in neck.      Past Medical History:  Diagnosis Date   H/O alcohol abuse    History of drug abuse (HCC)    Past Surgical History:  Procedure Laterality Date   HAND NERVE REPAIR  2007   tendon repair   Social History   Socioeconomic History   Marital status: Married    Spouse name: Not on file   Number of children: 4   Years of education: Not on file   Highest education level: Not on file  Occupational History   Occupation: Therapist, nutritional  Tobacco Use   Smoking status: Former    Types: Cigarettes   Smokeless tobacco: Never  Substance and Sexual Activity   Alcohol use: No    Comment: quit 3 years ago   Drug  use: No    Comment: quit 3 years ago   Sexual activity: Not on file  Other Topics Concern   Not on file  Social History Narrative   Not on file   Social Determinants of Health   Financial Resource Strain: Not on file  Food Insecurity: Not on file  Transportation Needs: Not on file  Physical Activity: Not on file  Stress: Not on file  Social Connections: Not on file   No Known Allergies Family History  Problem Relation Age of Onset   Healthy Mother    Healthy Father    Diabetes Neg Hx    Heart disease Neg Hx    Cancer Neg Hx      Current Outpatient Medications (Cardiovascular):    nitroGLYCERIN (NITRO-DUR) 0.2 mg/hr patch, Apply 1/4 of a patch to skin once daily.     Current Outpatient Medications (Other):    gabapentin (NEURONTIN) 100 MG capsule, Take 2 capsules (200 mg total) by mouth at bedtime.   Reviewed prior external information including notes and imaging from  primary care provider As well as notes that were available from care everywhere and other healthcare systems.  Past medical history, social, surgical and family history all reviewed in electronic medical record.  No pertanent information unless stated regarding to the chief complaint.   Review of Systems:  No headache, visual changes, nausea, vomiting, diarrhea, constipation, dizziness, abdominal pain, skin  rash, fevers, chills, night sweats, weight loss, swollen lymph nodes, body aches, joint swelling, chest pain, shortness of breath, mood changes. POSITIVE muscle aches  Objective  Blood pressure 122/86, pulse 92, height 5\' 11"  (1.803 m), weight 275 lb (124.7 kg), SpO2 98 %.   General: No apparent distress alert and oriented x3 mood and affect normal, dressed appropriately.  HEENT: Pupils equal, extraocular movements intact  Respiratory: Patient's speak in full sentences and does not appear short of breath  Cardiovascular: No lower extremity edema, non tender, no erythema  Low back does have loss  lordosis noted.  Some tenderness to palpation in the paraspinal musculature.  Tightness with straight leg test bilaterally. Patient's neck exam very mild loss of lordosis noted.  Left ankle exam no pain on the foot but patient does have some tenderness to palpation noted  Limited muscular skeletal ultrasound was performed and interpreted by Antoine Primas, M   Limited ultrasound shows that there is hypoechoic changes and increasing in Doppler flow of the Achilles tendon noted.  No true tear appreciated but does have some intersubstance neovascularization. Impression: Severe Achilles tendinitis  Osteopathic findings T9 extended rotated and side bent left L2 flexed rotated and side bent right Sacrum right on right    Impression and Recommendations:    The above documentation has been reviewed and is accurate and complete Judi Saa, DO

## 2023-03-25 ENCOUNTER — Ambulatory Visit (INDEPENDENT_AMBULATORY_CARE_PROVIDER_SITE_OTHER): Payer: BC Managed Care – PPO | Admitting: Family Medicine

## 2023-03-25 ENCOUNTER — Other Ambulatory Visit: Payer: Self-pay

## 2023-03-25 ENCOUNTER — Encounter: Payer: Self-pay | Admitting: Family Medicine

## 2023-03-25 VITALS — BP 122/86 | HR 92 | Ht 71.0 in | Wt 275.0 lb

## 2023-03-25 DIAGNOSIS — M9904 Segmental and somatic dysfunction of sacral region: Secondary | ICD-10-CM | POA: Diagnosis not present

## 2023-03-25 DIAGNOSIS — M7662 Achilles tendinitis, left leg: Secondary | ICD-10-CM

## 2023-03-25 DIAGNOSIS — M9902 Segmental and somatic dysfunction of thoracic region: Secondary | ICD-10-CM | POA: Diagnosis not present

## 2023-03-25 DIAGNOSIS — M79672 Pain in left foot: Secondary | ICD-10-CM | POA: Diagnosis not present

## 2023-03-25 DIAGNOSIS — M722 Plantar fascial fibromatosis: Secondary | ICD-10-CM

## 2023-03-25 DIAGNOSIS — M9903 Segmental and somatic dysfunction of lumbar region: Secondary | ICD-10-CM

## 2023-03-25 DIAGNOSIS — M5431 Sciatica, right side: Secondary | ICD-10-CM | POA: Diagnosis not present

## 2023-03-25 DIAGNOSIS — M5432 Sciatica, left side: Secondary | ICD-10-CM

## 2023-03-25 NOTE — Assessment & Plan Note (Signed)
Has had low back pain.  Does have some loss of lordosis  Discussed which activities to do  Core strength patient could do relatively well with some medications but wants to avoid it at this time.  We discussed to monitor any other radicular symptoms.  Follow-up again in 6 to 8 weeks

## 2023-03-25 NOTE — Assessment & Plan Note (Signed)
Near resolved at this time.  Nothing seen on the ultrasound today.

## 2023-03-25 NOTE — Assessment & Plan Note (Signed)
Questionable mild intersubstance tearing noted on the ultrasound.  We discussed moving the nitroglycerin to this area on the new problem.  Discussed continuing the recovery sandals.  Discussed with patient about home exercises working on the calf discomfort.  Follow-up with me again in 6 to 8 weeks otherwise.

## 2023-03-25 NOTE — Assessment & Plan Note (Signed)
   Decision today to treat with OMT was based on Physical Exam  After verbal consent patient was treated with HVLA, ME, FPR techniques in cervical, thoracic,  lumbar and sacral areas, all areas are chronic   Patient tolerated the procedure well with improvement in symptoms  Patient given exercises, stretches and lifestyle modifications  See medications in patient instructions if given  Patient will follow up in 4-8 weeks 

## 2023-03-25 NOTE — Patient Instructions (Addendum)
Move that nitro patch to achilles Do prescribed exercises at least 3x a week Tried manipulation today See you again in 7-8 weeks

## 2023-05-05 NOTE — Progress Notes (Signed)
[facility] [address] [place] [address] Phone: [phone] Subjective:   Dustin Hanna, am serving as a scribe for Dr. Antoine Primas.  I'm seeing this patient by the request  of:  Karie Schwalbe, MD  CC: Ankle pain, back pain  IEP:[id number]  Gor Thrapp is a 47 y.o. male coming in with complaint of back and neck pain. OMT on 03/25/2023. Also seen for achilles pain. Patient states that still has pain in achilles but pain is improving slowly. Less times when has sharp pain. Painful to push on insertion. Still using nitroglycerin patches.   Back is doing ok. Continues to have R hand tingling in 3rd and 4th fingers that is constant. Adjustment was helpful.   Medications patient has been prescribed: gabapentin nitropatch  Taking: Yes         Reviewed prior external information including notes and imaging from previsou exam, outside providers and external EMR if available.   As well as notes that were available from care everywhere and other healthcare systems.  Past medical history, social, surgical and family history all reviewed in electronic medical record.  No pertanent information unless stated regarding to the chief complaint.   Past Medical History:  Diagnosis Date   H/O alcohol abuse    History of drug abuse (HCC)     No Known Allergies   Review of Systems:  No headache, visual changes, nausea, vomiting, diarrhea, constipation, dizziness, abdominal pain, skin rash, fevers, chills, night sweats, weight loss, swollen lymph nodes, body aches, joint swelling, chest pain, shortness of breath, mood changes. POSITIVE muscle aches  Objective  Blood pressure 112/82, pulse 86, height 5\' 11"  (1.803 m), weight 273 lb (123.8 kg), SpO2 96%.   General: No apparent distress alert and oriented x3 mood and affect normal, dressed appropriately.  HEENT: Pupils equal, extraocular movements intact  Respiratory: Patient's speak in full  sentences and does not appear short of breath  Cardiovascular: No lower extremity edema, non tender, no erythema  Gait relatively normal at this moment. MSK:  Back does have tenderness to palpation of the paraspinal musculature.  Patient does have tightness with FABER test right greater than left.  Patient's left ankle but does have improvement in range of motion.  Nontender on exam today.  Osteopathic findings   T8 extended rotated and side bent left L1 flexed rotated and side bent right Sacrum right on right  Limited muscular skeletal ultrasound was performed and interpreted by Antoine Primas, M ;t  Limited ultrasound of patient's Achilles appears to have some very mild increase in Doppler flow with no significant hypoechoic changes noted.     Assessment and Plan:  Achilles tendinitis of left lower extremity No improvement noted again at this time.  Still has some very mild increase in Doppler flow noted.  No intersubstance tearing noted.  Continue with the home exercises, recovery sandals and nitroglycerin for now.  Follow-up with me again in 2 months where hopefully patient will have a completely resolved.  SCIATICA, LEFT Continue to work on core strength.  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  Responded well to osteopathic manipulation.  Increase hip abductor strengthening follow-up again in 6 to 8 weeks    Nonallopathic problems  Decision today to treat with OMT was based on Physical Exam  After verbal consent patient was treated with HVLA, ME, FPR techniques in thoracic, lumbar, and sacral  areas  Patient tolerated the procedure well with  improvement in symptoms  Patient given exercises, stretches and lifestyle modifications  See medications in patient instructions if given  Patient will follow up in 4-8 weeks     The above documentation has been reviewed and is accurate and complete Judi Saa, DO         Note: This  dictation was prepared with Dragon dictation along with smaller phrase technology. Any transcriptional errors that result from this process are unintentional.

## 2023-05-13 ENCOUNTER — Other Ambulatory Visit: Payer: Self-pay

## 2023-05-13 ENCOUNTER — Ambulatory Visit (INDEPENDENT_AMBULATORY_CARE_PROVIDER_SITE_OTHER): Payer: BC Managed Care – PPO | Admitting: Family Medicine

## 2023-05-13 ENCOUNTER — Encounter: Payer: Self-pay | Admitting: Family Medicine

## 2023-05-13 VITALS — BP 112/82 | HR 86 | Ht 71.0 in | Wt 273.0 lb

## 2023-05-13 DIAGNOSIS — M9902 Segmental and somatic dysfunction of thoracic region: Secondary | ICD-10-CM

## 2023-05-13 DIAGNOSIS — M5432 Sciatica, left side: Secondary | ICD-10-CM

## 2023-05-13 DIAGNOSIS — M7662 Achilles tendinitis, left leg: Secondary | ICD-10-CM

## 2023-05-13 DIAGNOSIS — M9903 Segmental and somatic dysfunction of lumbar region: Secondary | ICD-10-CM | POA: Diagnosis not present

## 2023-05-13 DIAGNOSIS — M9904 Segmental and somatic dysfunction of sacral region: Secondary | ICD-10-CM | POA: Diagnosis not present

## 2023-05-13 DIAGNOSIS — M5431 Sciatica, right side: Secondary | ICD-10-CM

## 2023-05-13 DIAGNOSIS — M79672 Pain in left foot: Secondary | ICD-10-CM

## 2023-05-13 NOTE — Assessment & Plan Note (Signed)
No improvement noted again at this time.  Still has some very mild increase in Doppler flow noted.  No intersubstance tearing noted.  Continue with the home exercises, recovery sandals and nitroglycerin for now.  Follow-up with me again in 2 months where hopefully patient will have a completely resolved.

## 2023-05-13 NOTE — Assessment & Plan Note (Signed)
Continue to work on core strength.  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  Responded well to osteopathic manipulation.  Increase hip abductor strengthening follow-up again in 6 to 8 weeks

## 2023-05-13 NOTE — Patient Instructions (Signed)
Continue exercises and nitroglycerin patches See me in 2 months

## 2023-05-21 NOTE — Progress Notes (Signed)
[facility] [address] [place] [address] Phone: [phone] Subjective:   Bruce Donath, am serving as a scribe for Dr. Antoine Primas.  I'm seeing this patient by the request  of:  Karie Schwalbe, MD  CC: Neck pain and radiation follow-up  BJY:[id number]  05/13/2023 OMT  Updated 05/27/2023 Dustin Hanna is a 47 y.o. male coming in with complaint of neck pain. Patient states that that he has bene having numbness in ring and middle finger in R hand. 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 Notes soreness in C spine but at the time he has "electrical" sensations.        Past Medical History:  Diagnosis Date   H/O alcohol abuse    History of drug abuse (HCC)    Past Surgical History:  Procedure Laterality Date   HAND NERVE REPAIR  2007   tendon repair   Social History   Socioeconomic History   Marital status: Married    Spouse name: Not on file   Number of children: 4   Years of education: Not on file   Highest education level: Not on file  Occupational History   Occupation: Therapist, nutritional  Tobacco Use   Smoking status: Former    Types: Cigarettes   Smokeless tobacco: Never  Substance and Sexual Activity   Alcohol use: No    Comment: quit 3 years ago   Drug use: No    Comment: quit 3 years ago   Sexual activity: Not on file  Other Topics Concern   Not on file  Social History Narrative   Not on file   Social Determinants of Health   Financial Resource Strain: Low Risk  (12/31/2022)   Received from [facility], [facility]   Overall Financial Resource Strain (CARDIA)    Difficulty of Paying Living Expenses: Not hard at all  Food Insecurity: No Food Insecurity (12/31/2022)   Received from [facility], [facility]   Hunger Vital Sign    Worried About Running Out of Food in the Last Year: Never true    Ran Out of Food in the Last Year: Never true   Transportation Needs: No Transportation Needs (12/31/2022)   Received from [facility], [facility]   PRAPARE - Transportation    Lack of Transportation (Medical): No    Lack of Transportation (Non-Medical): No  Physical Activity: Unknown (01/01/2023)   Received from [facility], [facility]   Exercise Vital Sign    Days of Exercise per Week: 0 days    Minutes of Exercise per Session: Not on file  Stress: Stress Concern Present (12/31/2022)   Received from [facility], [facility] - Occupational Stress Questionnaire    Feeling of Stress : To some extent  Social Connections: Somewhat Isolated (12/31/2022)   Received from [facility], [facility]   Social Network    How would you rate your social network (family, work, friends)?: Restricted participation with some degree of social isolation   No Known Allergies Family History  Problem Relation Age of Onset   Healthy Mother    Healthy Father    Diabetes Neg Hx    Heart disease Neg Hx    Cancer Neg Hx      Current Outpatient Medications (Cardiovascular):    nitroGLYCERIN (NITRO-DUR) 0.2 mg/hr patch, Apply 1/4 of a patch to skin once daily.  Review of Systems:  No headache, visual changes, nausea, vomiting, diarrhea, constipation, dizziness, abdominal pain, skin rash, fevers, chills, night sweats, weight loss, swollen lymph nodes, body aches, joint swelling, chest pain, shortness of breath, mood changes. POSITIVE muscle aches, radicular symptoms   Objective  Blood pressure 122/86, pulse 81, height 5\' 11"  (1.803 m), weight 271 lb (122.9 kg), SpO2 98%.   General: No apparent distress alert and oriented x3 mood and affect normal, dressed appropriately.  HEENT: Pupils equal, extraocular movements intact  Respiratory: Patient's speak in full sentences and does not appear short of breath  Cardiovascular: No lower extremity edema, non tender, no erythema  Patient's leg  exam does have a positive Spurling's noted.  Radicular symptoms in the C7 distribution.  Weakness noted of the middle finger bilaterally.  Patient does still have relatively reasonable grip strength noted.    Impression and Recommendations:    The above documentation has been reviewed and is accurate and complete Judi Saa, DO

## 2023-05-27 ENCOUNTER — Encounter: Payer: Self-pay | Admitting: Family Medicine

## 2023-05-27 ENCOUNTER — Ambulatory Visit (INDEPENDENT_AMBULATORY_CARE_PROVIDER_SITE_OTHER): Payer: BC Managed Care – PPO

## 2023-05-27 ENCOUNTER — Ambulatory Visit (INDEPENDENT_AMBULATORY_CARE_PROVIDER_SITE_OTHER): Payer: BC Managed Care – PPO | Admitting: Family Medicine

## 2023-05-27 VITALS — BP 122/86 | HR 81 | Ht 71.0 in | Wt 271.0 lb

## 2023-05-27 DIAGNOSIS — M542 Cervicalgia: Secondary | ICD-10-CM

## 2023-05-27 DIAGNOSIS — M5412 Radiculopathy, cervical region: Secondary | ICD-10-CM | POA: Insufficient documentation

## 2023-05-27 NOTE — Assessment & Plan Note (Signed)
Worsening cervical radiculitis at the moment.  Weakness in the C7 distribution.  Affecting daily activities and home exercises.  Discussed which activities to do in terms to avoid.  Increase activity slowly otherwise.  Follow-up with me again after imaging to further evaluate with patient already failing physical therapy from an outside facility and now onset of weakness noted with a positive Spurling's.

## 2023-05-27 NOTE — Patient Instructions (Signed)
Xray today [facility] [phone] Call Today  When we receive your results we will contact you.  

## 2023-06-25 ENCOUNTER — Ambulatory Visit
Admission: RE | Admit: 2023-06-25 | Discharge: 2023-06-25 | Disposition: A | Discharge status: Home / Self Care | Payer: BC Managed Care – PPO | Source: Ambulatory Visit | Attending: Family Medicine | Admitting: Family Medicine

## 2023-06-25 DIAGNOSIS — M542 Cervicalgia: Secondary | ICD-10-CM

## 2023-07-14 NOTE — Progress Notes (Deleted)
[facility] [address] [place] [address] Phone: [phone] Subjective:    I'm seeing this patient by the request  of:  Karie Schwalbe, MD  CC:   [id number]  05/27/2023 Worsening cervical radiculitis at the moment. Weakness in the C7 distribution. Affecting daily activities and home exercises. Discussed which activities to do in terms to avoid. Increase activity slowly otherwise. Follow-up with me again after imaging to further evaluate with patient already failing physical therapy from an outside facility and now onset of weakness noted with a positive Spurling's.   Updated 07/15/2023 Dustin Hanna is a 47 y.o. male coming in with complaint of L foot pain      Past Medical History:  Diagnosis Date   H/O alcohol abuse    History of drug abuse (HCC)    Past Surgical History:  Procedure Laterality Date   HAND NERVE REPAIR  2007   tendon repair   Social History   Socioeconomic History   Marital status: Married    Spouse name: Not on file   Number of children: 4   Years of education: Not on file   Highest education level: Not on file  Occupational History   Occupation: Therapist, nutritional  Tobacco Use   Smoking status: Former    Types: Cigarettes   Smokeless tobacco: Never  Substance and Sexual Activity   Alcohol use: No    Comment: quit 3 years ago   Drug use: No    Comment: quit 3 years ago   Sexual activity: Not on file  Other Topics Concern   Not on file  Social History Narrative   Not on file   Social Determinants of Health   Financial Resource Strain: Low Risk  (12/31/2022)   Received from [facility], [facility]   Overall Financial Resource Strain (CARDIA)    Difficulty of Paying Living Expenses: Not hard at all  Food Insecurity: No Food Insecurity (12/31/2022)   Received [facility], [facility]   Hunger Vital Sign    Worried About Running Out of Food in the Last Year:  Never true    Ran Out of Food in the Last Year: Never true  Transportation Needs: No Transportation Needs (12/31/2022)   Received from [facility], [facility]   PRAPARE - Transportation    Lack of Transportation (Medical): No    Lack of Transportation (Non-Medical): No  Physical Activity: Unknown (01/01/2023)   Received from [facility], [facility]   Exercise Vital Sign    Days of Exercise per Week: 0 days    Minutes of Exercise per Session: Not on file  Stress: Stress Concern Present (12/31/2022)   Received from [facility], [facility] - Occupational Stress Questionnaire    Feeling of Stress : To some extent  Social Connections: Somewhat Isolated (12/31/2022)   Received from [facility], [facility]   Social Network    How would you rate your social network (family, work, friends)?: Restricted participation with some degree of social isolation   No Known Allergies Family History  Problem Relation Age of Onset   Healthy Mother    Healthy Father    Diabetes Neg Hx    Heart disease Neg Hx    Cancer Neg Hx      Current Outpatient Medications (Cardiovascular):    nitroGLYCERIN (NITRO-DUR) 0.2 mg/hr patch, Apply 1/4 of a patch to skin once daily.  Reviewed prior external information including notes and imaging from  primary care provider As well as notes that were available from care everywhere and other healthcare systems.  Past medical history, social, surgical and family history all reviewed in electronic medical record.  No pertanent information unless stated regarding to the chief complaint.   Review of Systems:  No headache, visual changes, nausea, vomiting, diarrhea, constipation, dizziness, abdominal pain, skin rash, fevers, chills, night sweats, weight loss, swollen lymph nodes, body aches, joint swelling, chest pain, shortness of breath, mood changes. POSITIVE muscle aches  Objective  There were no  vitals taken for this visit.   General: No apparent distress alert and oriented x3 mood and affect normal, dressed appropriately.  HEENT: Pupils equal, extraocular movements intact  Respiratory: Patient's speak in full sentences and does not appear short of breath  Cardiovascular: No lower extremity edema, non tender, no erythema      Impression and Recommendations:

## 2023-07-14 NOTE — Progress Notes (Unsigned)
Dustin Hanna [address] [place] [address] Phone: [phone] Subjective:   Dustin Hanna, am serving as a scribe for Dr. Antoine Primas.  I'm seeing this patient by the request  of:  Karie Schwalbe, MD  CC:   Dustin Hanna  Dustin Hanna is a 47 y.o. male coming in with complaint of back and neck pain. OMT on 05/13/2023. Also seen for achilles pain. Patient states achilles pain not completely gone.  Cervical MRI patient did have an that was independently visualized by me showing significant facet arthropathy at multiple levels.  No specific nerve impingement noted.  No signs of any spinal stenosis. Medications patient has been prescribed:   Taking:         Reviewed prior external information including notes and imaging from previsou exam, outside providers and external EMR if available.   As well as notes that were available from care everywhere and other healthcare systems.  Past medical history, social, surgical and family history all reviewed in electronic medical record.  No pertanent information unless stated regarding to the chief complaint.   Past Medical History:  Diagnosis Date   H/O alcohol abuse    History of drug abuse (HCC)     No Known Allergies   Review of Systems:  No headache, visual changes, nausea, vomiting, diarrhea, constipation, dizziness, abdominal pain, skin rash, fevers, chills, night sweats, weight loss, swollen lymph nodes, body aches, joint swelling, chest pain, shortness of breath, mood changes. POSITIVE muscle aches  Objective  Blood pressure (!) 130/90, pulse 87, height 5\' 11"  (1.803 m), weight 274 lb (124.3 kg), SpO2 99%.   General: No apparent distress alert and oriented x3 mood and affect normal, dressed appropriately.  HEENT: Pupils equal, extraocular movements intact  Respiratory: Patient's speak in full sentences and does not appear short of breath  Cardiovascular: No lower extremity  edema, non tender, no erythema  Neck exam does have some loss of lordosis.  Some limited extension of the neck noted.  Patient still has positive Spurling's with radicular symptoms in the C7-8 distribution on the right side.  Patient also has what appears to be swelling noted in the calcaneal area.  This is on the left side.  Still tender to palpation.  No pain over the Achilles itself.  Hagland nodule noted.   Limited muscular skeletal ultrasound was performed and interpreted by Antoine Primas, M  Limited ultrasound still shows some hypoechoic changes in the surrounding area.  Mild increase in Doppler flow of the distal Achilles but no true tearing noted  Impression: Continued calcaneal spur with mild Achilles tendinitis    Assessment and Plan:  Cervical radiculitis Continues to have Signs and Symptoms of COVID-19 (e.g., fever, dry cough, and/or SOB), no diagnosis made (assign the appropriate code(s)):    In the C7 and C8 distribution on the right side.  This is correlated to some of the MRI well.  Discussed with patient about an epidural which patient would like to try.  And see if this will help [place] therapeutically as well as diagnostically.  We did discuss different medications such as Cymbalta and Effexor as a possibility as well which patient will consider.  Discussed icing regimen and home exercises otherwise.  Follow-up again with me 6 weeks after the epidural to see how patient responds    Achilles tendinitis of left lower extremity Continues to have some inflammation on ultrasound.  He still has been having some mild increase in neovascularization  in the tendon sheath.  Discussed icing regimen, continuing to wear the good shoes.  Discussed otherwise we will need to consider the possibility of other treatment options secondary to the bone spur.  Patient wants to continue with conservative therapy at this time           The above documentation has been reviewed and is accurate  and complete Judi Saa, DO         Note: This dictation was prepared with Dragon dictation along with smaller phrase technology. Any transcriptional errors that result from this process are unintentional.

## 2023-07-15 ENCOUNTER — Other Ambulatory Visit: Payer: Self-pay

## 2023-07-15 ENCOUNTER — Ambulatory Visit (INDEPENDENT_AMBULATORY_CARE_PROVIDER_SITE_OTHER): Payer: BC Managed Care – PPO | Admitting: Family Medicine

## 2023-07-15 ENCOUNTER — Encounter: Payer: Self-pay | Admitting: Family Medicine

## 2023-07-15 VITALS — BP 130/90 | HR 87 | Ht 71.0 in | Wt 274.0 lb

## 2023-07-15 DIAGNOSIS — M5412 Radiculopathy, cervical region: Secondary | ICD-10-CM

## 2023-07-15 DIAGNOSIS — M7662 Achilles tendinitis, left leg: Secondary | ICD-10-CM

## 2023-07-15 NOTE — Patient Instructions (Addendum)
[facility] [phone]  See you again 6 weeks after epidural

## 2023-07-15 NOTE — Assessment & Plan Note (Signed)
Continues to have some inflammation on ultrasound.  He still has been having some mild increase in neovascularization in the tendon sheath.  Discussed icing regimen, continuing to wear the good shoes.  Discussed otherwise we will need to consider the possibility of other treatment options secondary to the bone spur.  Patient wants to continue with conservative therapy at this time

## 2023-07-15 NOTE — Assessment & Plan Note (Signed)
Continues to have Signs and Symptoms of COVID-19 (e.g., fever, dry cough, and/or SOB), no diagnosis made (assign the appropriate code(s)):    In the C7 and C8 distribution on the right side.  This is correlated to some of the MRI well.  Discussed with patient about an epidural which patient would like to try.  And see if this will help [place] therapeutically as well as diagnostically.  We did discuss different medications such as Cymbalta and Effexor as a possibility as well which patient will consider.  Discussed icing regimen and home exercises otherwise.  Follow-up again with me 6 weeks after the epidural to see how patient responds

## 2023-07-16 ENCOUNTER — Encounter: Payer: Self-pay | Admitting: Internal Medicine

## 2023-07-23 NOTE — Discharge Instructions (Signed)

## 2023-07-24 ENCOUNTER — Ambulatory Visit
Admission: RE | Admit: 2023-07-24 | Discharge: 2023-07-24 | Disposition: A | Discharge status: Home / Self Care | Payer: BC Managed Care – PPO | Source: Ambulatory Visit | Attending: Family Medicine | Admitting: Family Medicine

## 2023-07-24 DIAGNOSIS — M5412 Radiculopathy, cervical region: Secondary | ICD-10-CM

## 2023-07-24 MED ORDER — IOPAMIDOL (ISOVUE-M 300) INJECTION 61%
1.0000 mL | Freq: Once | INTRAMUSCULAR | Status: AC
  Administered 2023-07-24: 1 mL via EPIDURAL

## 2023-07-24 MED ORDER — TRIAMCINOLONE ACETONIDE 40 MG/ML IJ SUSP (RADIOLOGY)
60.0000 mg | Freq: Once | INTRAMUSCULAR | Status: AC
  Administered 2023-07-24: 60 mg via EPIDURAL

## 2024-02-27 ENCOUNTER — Emergency Department (HOSPITAL_COMMUNITY)
Admission: EM | Admit: 2024-02-27 | Discharge: 2024-02-28 | Disposition: A | Discharge status: Home / Self Care | Attending: Emergency Medicine | Admitting: Emergency Medicine

## 2024-02-27 ENCOUNTER — Other Ambulatory Visit: Payer: Self-pay

## 2024-02-27 ENCOUNTER — Encounter (HOSPITAL_COMMUNITY): Payer: Self-pay | Admitting: *Deleted

## 2024-02-27 DIAGNOSIS — J9801 Acute bronchospasm: Secondary | ICD-10-CM | POA: Diagnosis not present

## 2024-02-27 DIAGNOSIS — Z87891 Personal history of nicotine dependence: Secondary | ICD-10-CM | POA: Diagnosis not present

## 2024-02-27 DIAGNOSIS — R0602 Shortness of breath: Secondary | ICD-10-CM | POA: Diagnosis present

## 2024-02-27 NOTE — ED Triage Notes (Signed)
 He choked on a pill earlier today he has had a burning sensation in his throat and chest since then.  He was seen at a urgent care that did get a chest xray and he was told to come here for treatment

## 2024-02-28 ENCOUNTER — Emergency Department (HOSPITAL_COMMUNITY)

## 2024-02-28 MED ORDER — ALBUTEROL SULFATE HFA 108 (90 BASE) MCG/ACT IN AERS
2.0000 | INHALATION_SPRAY | Freq: Once | RESPIRATORY_TRACT | Status: AC
  Administered 2024-02-28: 2 via RESPIRATORY_TRACT
  Filled 2024-02-28: qty 6.7

## 2024-02-28 MED ORDER — AMOXICILLIN-POT CLAVULANATE 875-125 MG PO TABS: 1.0000 | ORAL_TABLET | Freq: Two times a day (BID) | ORAL | 0 refills | Status: AC

## 2024-02-28 MED ORDER — DEXAMETHASONE 4 MG PO TABS
8.0000 mg | ORAL_TABLET | Freq: Once | ORAL | Status: AC
  Administered 2024-02-28: 8 mg via ORAL
  Filled 2024-02-28: qty 2

## 2024-02-28 NOTE — ED Provider Notes (Signed)
 [place] EMERGENCY DEPARTMENT AT [facility] Provider Note   CSN: [id number] Arrival date & time: 02/27/24  2122     History  Chief complaint-cough  Dustin Hanna is a 48 y.o. male.  The history is provided by the patient and a significant other.  Patient was around 3 PM yesterday he was swallowing an ibuprofen without water.  He reports he had had headache and bodyaches.  When he was swallowing the pill he started to choke and he is concerned that he aspirated.  He did not have any coughing or wheezing before and, but since that time he has persistent cough and shortness of breath and wheezing.  He also reports chest discomfort.  No hemoptysis, no vomiting  he is a former smoker He went to an urgent care who told him to go to the ER for bronchoscopy    Past Medical History:  Diagnosis Date   H/O alcohol abuse    History of drug abuse (HCC)     Home Medications Prior to Admission medications   Medication Sig Start Date End Date Taking? Authorizing Provider  amoxicillin-clavulanate (AUGMENTIN) 875-125 MG tablet Take 1 tablet by mouth every 12 (twelve) hours. 02/28/24  Yes Eldon Greenland, MD  nitroGLYCERIN  (NITRO-DUR ) 0.2 mg/hr patch Apply 1/4 of a patch to skin once daily. 02/11/23   Smith, Zachary M, DO      Allergies    Patient has no known allergies.    Review of Systems   Review of Systems  Respiratory:  Positive for cough, shortness of breath and wheezing.   Cardiovascular:  Positive for chest pain.    Physical Exam Updated Vital Signs BP 118/71   Pulse 70   Temp 97.7 F (36.5 C) (Oral)   Resp 16   Ht 1.803 m (5\' 11" )   Wt 124.3 kg   SpO2 100%   BMI 38.22 kg/m  Physical Exam CONSTITUTIONAL: Well developed/well nourished, no distress HEAD: Normocephalic/atraumatic EYES: EOMI/PERRL ENMT: Mucous membranes moist, uvula midline, no stridor, no drooling, he is handling his secretions NECK: supple no meningeal signs CV: S1/S2 noted, no  murmurs/rubs/gallops noted LUNGS: Coughs frequently during exam, wheezing noted ABDOMEN: soft NEURO: Pt is awake/alert/appropriate, moves all extremitiesx4.  No facial droop.   SKIN: warm, color normal PSYCH: no abnormalities of mood noted, alert and oriented to situation  ED Results / Procedures / Treatments   Labs (all labs ordered are listed, but only abnormal results are displayed) Labs Reviewed - No data to display  EKG None  Radiology CT CHEST WO CONTRAST Result Date: 02/28/2024 CLINICAL DATA:  Concern for pill aspiration.  Wheezing EXAM: CT CHEST WITHOUT CONTRAST TECHNIQUE: Multidetector CT imaging of the chest was performed following the standard protocol without IV contrast. RADIATION DOSE REDUCTION: This exam was performed according to the departmental dose-optimization program which includes automated exposure control, adjustment of the mA and/or kV according to patient size and/or use of iterative reconstruction technique. COMPARISON:  None Available. FINDINGS: Cardiovascular: No significant vascular findings. Normal heart size. No pericardial effusion. Mediastinum/Nodes: No esophageal thickening or foreign body seen. No mass or adenopathy. Lungs/Pleura: No airway foreign body or obstruction seen. No indication of aspiration pneumonia, edema, effusion, or pneumothorax. Angular, ovoid nodule along the right major and minor fissure is most consistent with lymph node, 4 mm in average diameter on 4:82. borderline generalized airway thickening. Upper Abdomen: No acute finding.  Hepatic steatosis. Musculoskeletal: Multilevel spondylitic spurring, bridging at multiple levels. IMPRESSION: Patent airways without signs of  aspiration. Hepatic steatosis. Electronically Signed   By: Ronnette Coke M.D.   On: 02/28/2024 05:47    Procedures Procedures    Medications Ordered in ED Medications  dexamethasone (DECADRON) tablet 8 mg (has no administration in time range)  albuterol (VENTOLIN HFA)  108 (90 Base) MCG/ACT inhaler 2 puff (2 puffs Inhalation Given 02/28/24 0157)    ED Course/ Medical Decision Making/ A&P Clinical Course as of 02/28/24 1610  Sat Feb 28, 2024  0211 Patient is had cough and wheezing ever since swelling ibuprofen tablet he suspects he aspirated.  Already seen in urgent care and sent for evaluation.  Discussed with radiology, will start with CT chest without contrast [DW]  0608 CT scan is negative.  Patient has been resting comfortably for several hours. He still has cough but overall feels improved.  No hypoxia or tachypnea.  Unclear cause of acute cough though at this point aspiration is less likely For precaution, will place him on oral antibiotics for a week.  He will continue his albuterol and will give a one-time dose of Decadron to help with his cough  Discussed strict ER return precautions, but if his symptoms persist without worsening, he should see his PCP in 7 to 10 days [DW]    Clinical Course User Index [DW] Eldon Greenland, MD                                 Medical Decision Making Amount and/or Complexity of Data Reviewed Radiology: ordered.  Risk Prescription drug management.   This patient presents to the ED for concern of cough and shortness of breath, this involves an extensive number of treatment options, and is a complaint that carries with it a high risk of complications and morbidity.  The differential diagnosis includes but is not limited to Acute coronary syndrome, pneumonia, acute pulmonary edema, pneumothorax, acute anemia, pulmonary embolism, aspiration pneumonia, bronchospasm    Social Determinants of Health: Patient's previous tobacco use  increases the complexity of managing their presentation  Additional history obtained: Additional history obtained from significant other Records reviewed Care Everywhere/External Records  Imaging Studies ordered: I ordered imaging studies including CT scan chest  I independently  visualized and interpreted imaging which showed no acute findings I agree with the radiologist interpretation  Medicines ordered and prescription drug management: I ordered medication including albuterol for cough Reevaluation of the patient after these medicines showed that the patient    improved   Consultations Obtained: I requested consultation with the radiologist Dr. Luanna Rung., and discussed  findings as well as pertinent plan - they recommend: CT chest without contrast could identify foreign body  Reevaluation: After the interventions noted above, I reevaluated the patient and found that they have :improved  Complexity of problems addressed: Patient's presentation is most consistent with  acute presentation with potential threat to life or bodily function  Disposition: After consideration of the diagnostic results and the patient's response to treatment,  I feel that the patent would benefit from discharge  .           Final Clinical Impression(s) / ED Diagnoses Final diagnoses:  Acute bronchospasm    Rx / DC Orders ED Discharge Orders          Ordered    amoxicillin-clavulanate (AUGMENTIN) 875-125 MG tablet  Every 12 hours        02/28/24 0607  Eldon Greenland, MD 02/28/24 [phone]

## 2024-02-28 NOTE — Discharge Instructions (Addendum)
 If you have any acute worsening of cough, coughing up blood, severe shortness of breath or severe chest pain or fever over 100 in the next 2 to 3 days please return to the ER
